# Patient Record
Sex: Female | Born: 1970 | Race: Black or African American | Hispanic: No | Marital: Single | Smoking: Never smoker
Health system: Southern US, Community
[De-identification: ages and names within clinical notes are randomized; demographics above are authoritative.]

## PROBLEM LIST (undated history)

## (undated) DIAGNOSIS — K3184 Gastroparesis: Secondary | ICD-10-CM

## (undated) DIAGNOSIS — E079 Disorder of thyroid, unspecified: Secondary | ICD-10-CM

## (undated) DIAGNOSIS — E119 Type 2 diabetes mellitus without complications: Secondary | ICD-10-CM

## (undated) HISTORY — PX: ABDOMINAL HYSTERECTOMY: SHX81

---

## 2019-07-01 ENCOUNTER — Emergency Department (HOSPITAL_COMMUNITY)
Admission: EM | Admit: 2019-07-01 | Discharge: 2019-07-02 | Disposition: A | Discharge status: Home / Self Care | Payer: BC Managed Care – PPO | Attending: Emergency Medicine | Admitting: Emergency Medicine

## 2019-07-01 ENCOUNTER — Emergency Department (HOSPITAL_COMMUNITY): Payer: BC Managed Care – PPO

## 2019-07-01 ENCOUNTER — Encounter (HOSPITAL_COMMUNITY): Payer: Self-pay | Admitting: Emergency Medicine

## 2019-07-01 DIAGNOSIS — R109 Unspecified abdominal pain: Secondary | ICD-10-CM | POA: Insufficient documentation

## 2019-07-01 DIAGNOSIS — E119 Type 2 diabetes mellitus without complications: Secondary | ICD-10-CM | POA: Insufficient documentation

## 2019-07-01 DIAGNOSIS — M25551 Pain in right hip: Secondary | ICD-10-CM | POA: Diagnosis not present

## 2019-07-01 HISTORY — DX: Disorder of thyroid, unspecified: E07.9

## 2019-07-01 HISTORY — DX: Gastroparesis: K31.84

## 2019-07-01 HISTORY — DX: Type 2 diabetes mellitus without complications: E11.9

## 2019-07-01 MED ORDER — ACETAMINOPHEN 325 MG PO TABS
650.0000 mg | ORAL_TABLET | Freq: Once | ORAL | Status: AC
  Administered 2019-07-01: 650 mg via ORAL
  Filled 2019-07-01: qty 2

## 2019-07-01 NOTE — ED Triage Notes (Signed)
Pt presents with family c/o pain from R scapula to R hip x 5 days, no injury per family.  Pt moaning loudly, very difficult to understand, pt does state she took tramadol today.

## 2019-07-02 ENCOUNTER — Emergency Department (HOSPITAL_COMMUNITY): Payer: BC Managed Care – PPO

## 2019-07-02 LAB — COMPREHENSIVE METABOLIC PANEL
ALT: 11 U/L (ref 0–44)
AST: 15 U/L (ref 15–41)
Albumin: 4.2 g/dL (ref 3.5–5.0)
Alkaline Phosphatase: 113 U/L (ref 38–126)
Anion gap: 16 — ABNORMAL HIGH (ref 5–15)
BUN: 24 mg/dL — ABNORMAL HIGH (ref 6–20)
CO2: 25 mmol/L (ref 22–32)
Calcium: 10.2 mg/dL (ref 8.9–10.3)
Chloride: 87 mmol/L — ABNORMAL LOW (ref 98–111)
Creatinine, Ser: 1.01 mg/dL — ABNORMAL HIGH (ref 0.44–1.00)
GFR calc Af Amer: >60 mL/min (ref >60)
GFR calc non Af Amer: >60 mL/min (ref >60)
Glucose, Bld: 400 mg/dL — ABNORMAL HIGH (ref 70–99)
Potassium: 4.2 mmol/L (ref 3.5–5.1)
Sodium: 128 mmol/L — ABNORMAL LOW (ref 135–145)
Total Bilirubin: 1.3 mg/dL — ABNORMAL HIGH (ref 0.3–1.2)
Total Protein: 8 g/dL (ref 6.5–8.1)

## 2019-07-02 LAB — CBG MONITORING, ED: Glucose-Capillary: 358 mg/dL — ABNORMAL HIGH (ref 70–99)

## 2019-07-02 LAB — URINALYSIS, ROUTINE W REFLEX MICROSCOPIC
Bilirubin Urine: NEGATIVE
Glucose, UA: >=500 mg/dL — AB
Hgb urine dipstick: NEGATIVE
Ketones, ur: 5 mg/dL — AB
Leukocytes,Ua: NEGATIVE
Nitrite: NEGATIVE
Protein, ur: NEGATIVE mg/dL
Specific Gravity, Urine: 1.016 (ref 1.005–1.030)
pH: 5 (ref 5.0–8.0)

## 2019-07-02 LAB — POC URINE PREG, ED: Preg Test, Ur: NEGATIVE

## 2019-07-02 LAB — CBC WITH DIFFERENTIAL/PLATELET
Abs Immature Granulocytes: 0.02 10*3/uL (ref 0.00–0.07)
Basophils Absolute: 0 10*3/uL (ref 0.0–0.1)
Basophils Relative: 0 %
Eosinophils Absolute: 0 10*3/uL (ref 0.0–0.5)
Eosinophils Relative: 0 %
HCT: 44.1 % (ref 36.0–46.0)
Hemoglobin: 15.6 g/dL — ABNORMAL HIGH (ref 12.0–15.0)
Immature Granulocytes: 0 %
Lymphocytes Relative: 49 %
Lymphs Abs: 3.8 10*3/uL (ref 0.7–4.0)
MCH: 27.3 pg (ref 26.0–34.0)
MCHC: 35.4 g/dL (ref 30.0–36.0)
MCV: 77.1 fL — ABNORMAL LOW (ref 80.0–100.0)
Monocytes Absolute: 0.7 10*3/uL (ref 0.1–1.0)
Monocytes Relative: 8 %
Neutro Abs: 3.4 10*3/uL (ref 1.7–7.7)
Neutrophils Relative %: 43 %
Platelets: 392 10*3/uL (ref 150–400)
RBC: 5.72 MIL/uL — ABNORMAL HIGH (ref 3.87–5.11)
RDW: 12.6 % (ref 11.5–15.5)
WBC: 8 10*3/uL (ref 4.0–10.5)
nRBC: 0 % (ref 0.0–0.2)

## 2019-07-02 MED ORDER — MORPHINE SULFATE (PF) 4 MG/ML IV SOLN
4.0000 mg | Freq: Once | INTRAVENOUS | Status: AC
  Administered 2019-07-02: 03:00:00 4 mg via INTRAVENOUS
  Filled 2019-07-02: qty 1

## 2019-07-02 MED ORDER — SODIUM CHLORIDE 0.9 % IV BOLUS
1000.0000 mL | Freq: Once | INTRAVENOUS | Status: AC
  Administered 2019-07-02: 1000 mL via INTRAVENOUS

## 2019-07-02 MED ORDER — ONDANSETRON HCL 4 MG/2ML IJ SOLN
4.0000 mg | Freq: Once | INTRAMUSCULAR | Status: AC
  Administered 2019-07-02: 03:00:00 4 mg via INTRAVENOUS
  Filled 2019-07-02: qty 2

## 2019-07-02 NOTE — ED Notes (Signed)
Patient taken to CT.

## 2019-07-02 NOTE — Discharge Instructions (Addendum)
Continue medications as previously prescribed.  Keep a record of your blood sugars over the next few days and take this with you to your next doctor's appointment.  Follow-up with primary doctor in the next 2 to 3 days, and return to the ER if symptoms significantly worsen or change.

## 2019-07-02 NOTE — ED Provider Notes (Signed)
[facility] Provider Note   CSN: [id number] Arrival date & time: 07/01/19  1950     History Chief Complaint  Patient presents with  . Hip Pain    Bernadett Milian is a 49 y.o. female.  Patient is a 49 year old female with past medical history of diabetes and gastroparesis.  She presents today for evaluation of pain in her right hip and flank.  This is been ongoing for the past several days.  It began in the absence of any injury or trauma.  She describes severe pain from her shoulder blade down through her groin that is worse when she moves or palpates the area.  She denies any bowel or bladder complaints.  She denies any numbness or tingling.  The history is provided by the patient.  Hip Pain This is a new problem. The problem occurs constantly. The problem has been gradually worsening. Pertinent negatives include no chest pain. The symptoms are aggravated by bending and twisting. Nothing relieves the symptoms.       Past Medical History:  Diagnosis Date  . Diabetes mellitus without complication ([place])   . Gastroparesis   . Thyroid disease     There are no problems to display for this patient.   Past Surgical History:  Procedure Laterality Date  . ABDOMINAL HYSTERECTOMY       OB History   No obstetric history on file.     No family history on file.  Social History   Tobacco Use  . Smoking status: Never Smoker  . Smokeless tobacco: Never Used  Substance Use Topics  . Alcohol use: Never  . Drug use: Never    Home Medications Prior to Admission medications   Not on File    Allergies    Patient has no known allergies.  Review of Systems   Review of Systems  Cardiovascular: Negative for chest pain.  All other systems reviewed and are negative.   Physical Exam Updated Vital Signs BP 112/80   Pulse 80   Temp (!) 97.1 F (36.2 C)   Resp 17   SpO2 97%   Physical Exam Vitals and nursing note reviewed.    Constitutional:      General: She is not in acute distress.    Appearance: She is well-developed. She is not diaphoretic.  HENT:     Head: Normocephalic and atraumatic.  Cardiovascular:     Rate and Rhythm: Normal rate and regular rhythm.     Heart sounds: No murmur. No friction rub. No gallop.   Pulmonary:     Effort: Pulmonary effort is normal. No respiratory distress.     Breath sounds: Normal breath sounds. No wheezing.  Abdominal:     General: Bowel sounds are normal. There is no distension.     Palpations: Abdomen is soft.     Tenderness: There is no abdominal tenderness.  Musculoskeletal:        General: Normal range of motion.     Cervical back: Normal range of motion and neck supple.  Skin:    General: Skin is warm and dry.  Neurological:     Mental Status: She is alert and oriented to person, place, and time.     ED Results / Procedures / Treatments   Labs (all labs ordered are listed, but only abnormal results are displayed) Labs Reviewed  URINALYSIS, ROUTINE W REFLEX MICROSCOPIC  COMPREHENSIVE METABOLIC PANEL  CBC WITH DIFFERENTIAL/PLATELET  I-STAT BETA HCG BLOOD, ED ([facility], [facility],  [facility] ONLY)    EKG None  Radiology DG Hip Unilat  With Pelvis 2-3 Views Right  Result Date: 07/01/2019 CLINICAL DATA:  49 year old female with right hip pain for 5 days. EXAM: DG HIP (WITH OR WITHOUT PELVIS) 2-3V RIGHT COMPARISON:  None. FINDINGS: The AP pelvis is mildly rotated to the left. Femoral heads are normally located. Bone mineralization is within normal limits. The pelvis appears intact. Proximal left femur appears grossly intact. Proximal right femur appears intact and normal. SI joints are within normal limits. Negative visible bowel gas pattern. Mild pelvic phleboliths. IMPRESSION: Negative radiographic appearance of the right hip and pelvis. Electronically Signed   By: Odessa Fleming M.D.   On: 07/01/2019 21:13    Procedures Procedures (including critical care time)  Medications  Ordered in ED Medications  sodium chloride 0.9 % bolus 1,000 mL (has no administration in time range)  ondansetron (ZOFRAN) injection 4 mg (has no administration in time range)  morphine 4 MG/ML injection 4 mg (has no administration in time range)  acetaminophen (TYLENOL) tablet 650 mg (650 mg Oral Given 07/01/19 2039)    ED Course  I have reviewed the triage vital signs and the nursing notes.  Pertinent labs & imaging results that were available during my care of the patient were reviewed by me and considered in my medical decision making (see chart for details).    MDM Rules/Calculators/A&P  Patient presenting here with complaints of pain in her flank from her shoulder blade to her hip.  This began earlier today in the absence of any injury or trauma.  Patient's physical examination is essentially unremarkable and work-up shows no obvious cause of her discomfort.  Urinalysis is basically clear, laboratory studies show a blood sugar of 400 with anion gap of 16.  She was given NS and sugar rechecked at 358.  Her CO2 is normal and has only trace ketones in her urine.  I do not feel as though this presentation is consistent with DKA and believe she is appropriate for discharge.  Her pain seems very musculoskeletal in nature.  Final Clinical Impression(s) / ED Diagnoses Final diagnoses:  None    Rx / DC Orders ED Discharge Orders    None       Geoffery Lyons, MD 07/02/19 [phone]

## 2019-07-02 NOTE — ED Notes (Signed)
Kimberly Spears, daughter, [phone] would like an update when available

## 2020-12-27 IMAGING — CT CT RENAL STONE PROTOCOL
2 of 4 series · 16 of 46 positions shown, 18 images · non-contrast
Comparison: No prior abdominal imaging. Pelvis and right hip
radiograph yesterday.

CLINICAL DATA: Right flank pain with nausea.

EXAM:
CT ABDOMEN AND PELVIS WITHOUT CONTRAST
TECHNIQUE: Multidetector CT imaging of the abdomen and pelvis was performed
following the standard protocol without IV contrast.

[Series 3: ap without · axial · non-contrast · 0.67mm/px · z∈[-427,-67]mm · 13 of 84 slices shown, 15 images]
[im 6/84  soft-tissue]
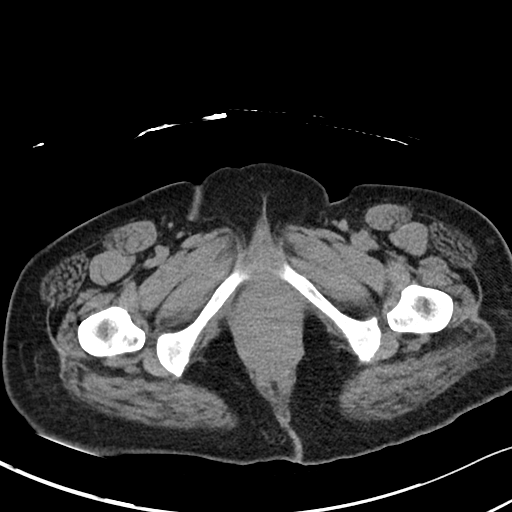
[im 6/84  bone]
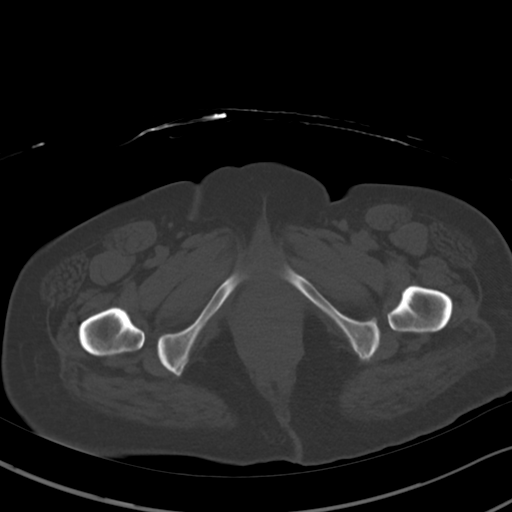
[im 12/84  soft-tissue]
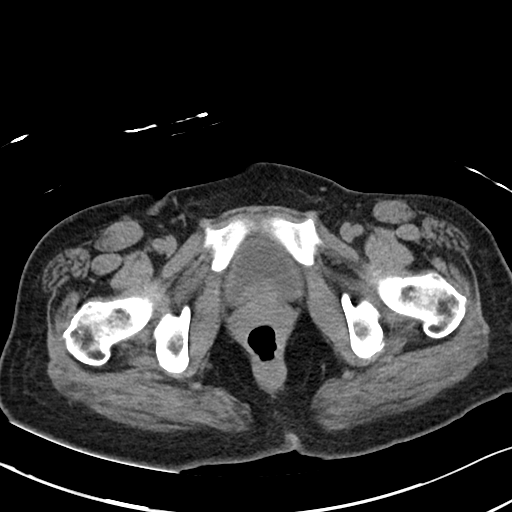
[im 17/84  soft-tissue]
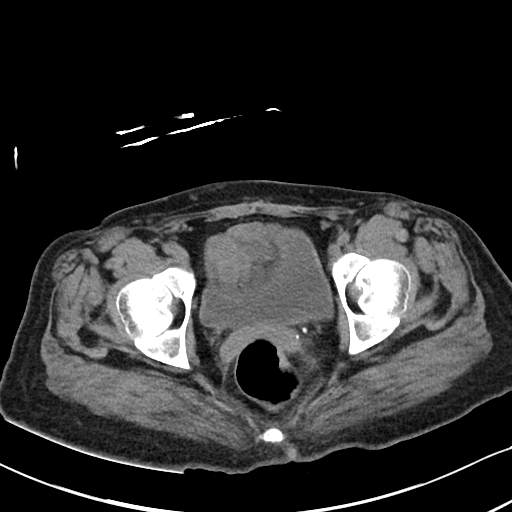
[im 23/84  soft-tissue]
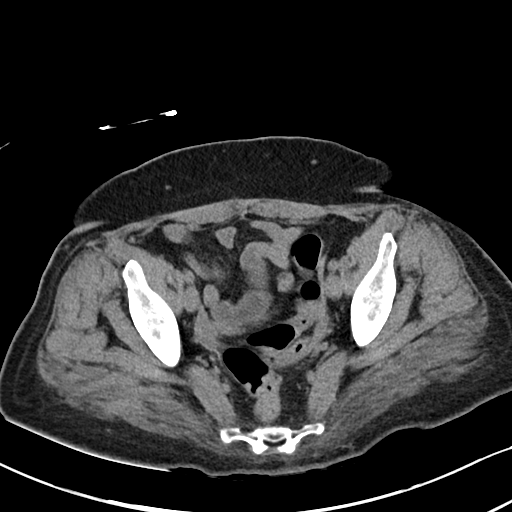
[im 28/84  soft-tissue]
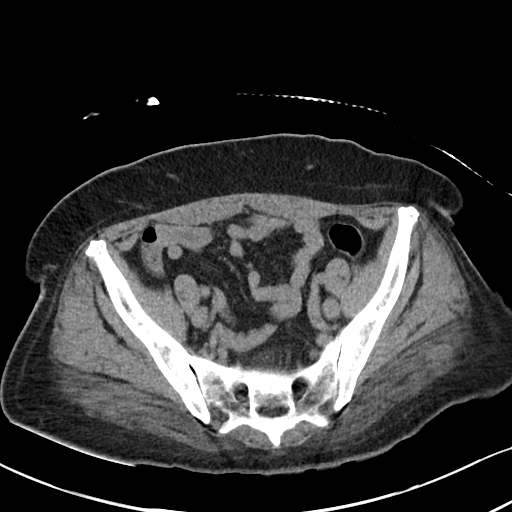
[im 34/84  soft-tissue]
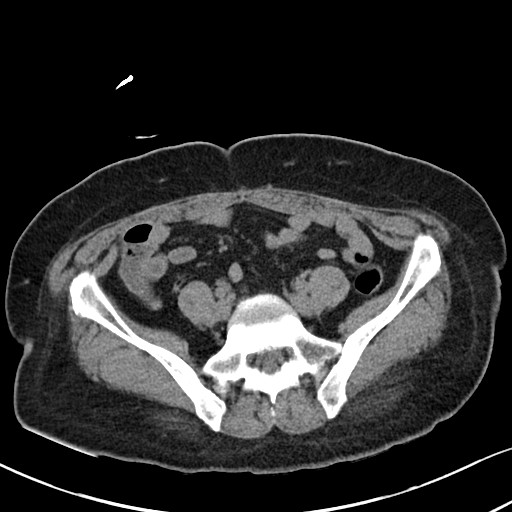
[im 45/84  soft-tissue]
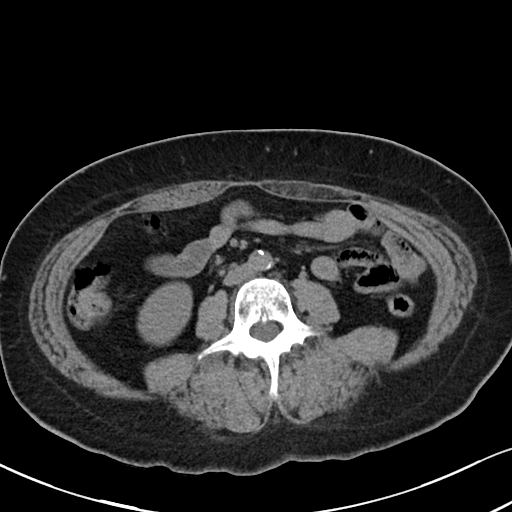
[im 50/84  soft-tissue]
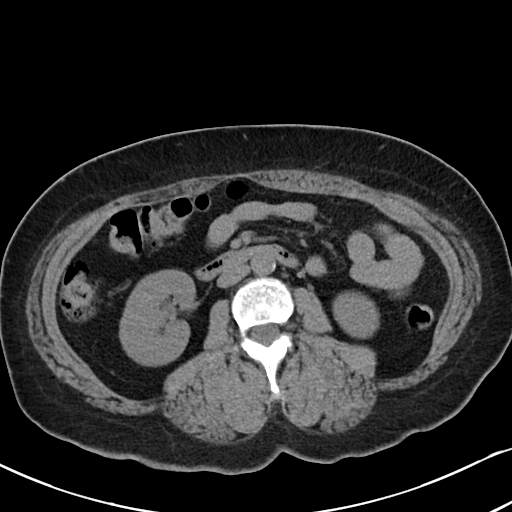
[im 56/84  soft-tissue]
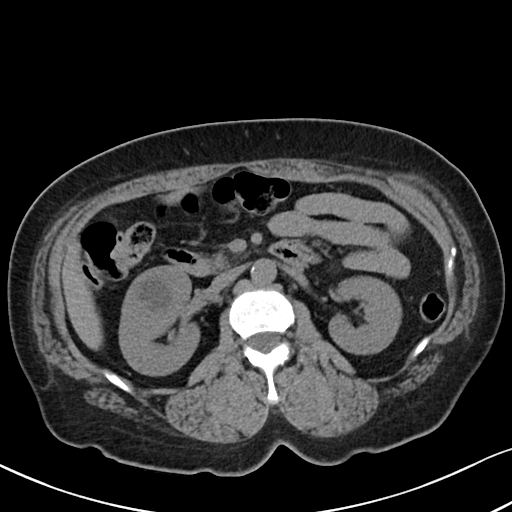
[im 56/84  bone]
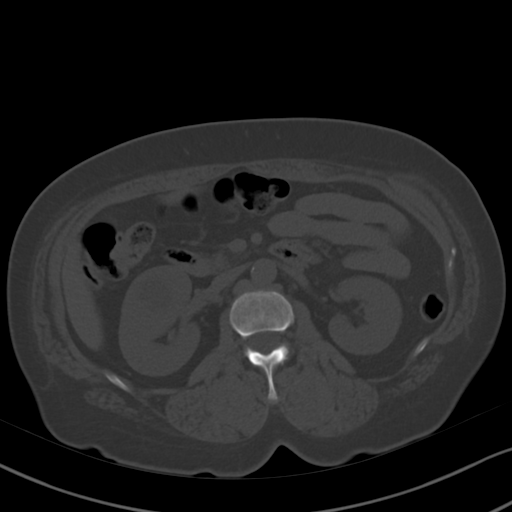
[im 61/84  soft-tissue]
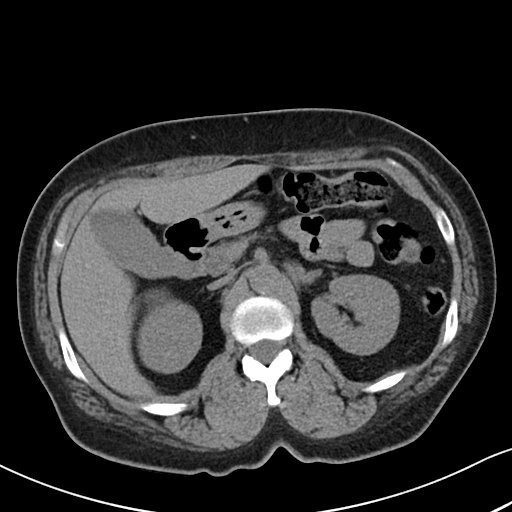
[im 67/84  soft-tissue]
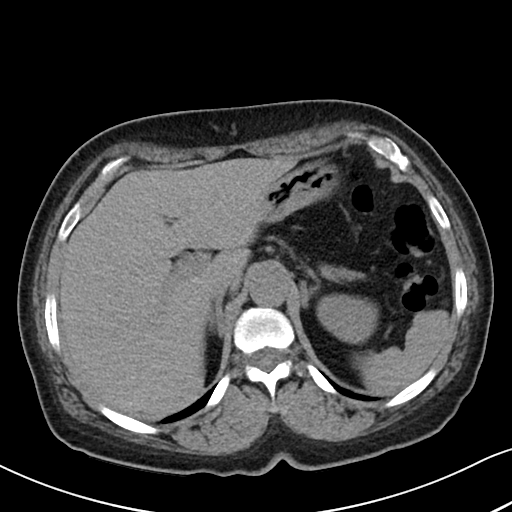
[im 72/84  soft-tissue]
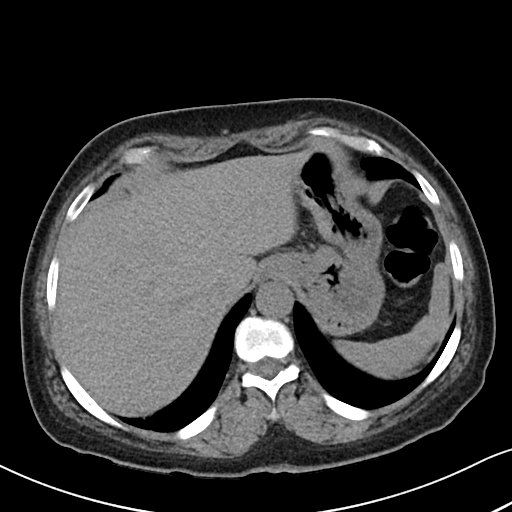
[im 78/84  soft-tissue]
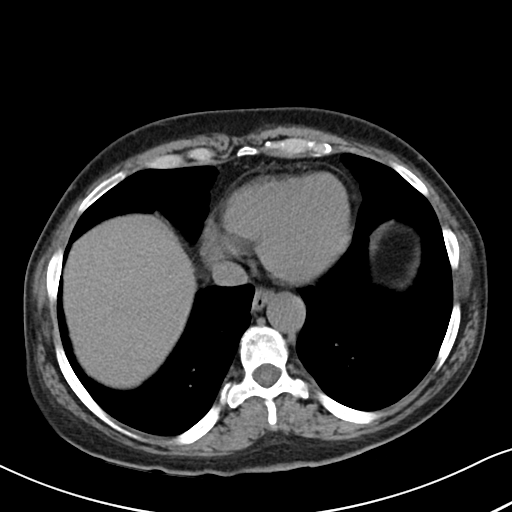

[Series 6: cor · coronal · 0.63mm/px · 3 of 85 slices shown]
[im 29/85  soft-tissue]
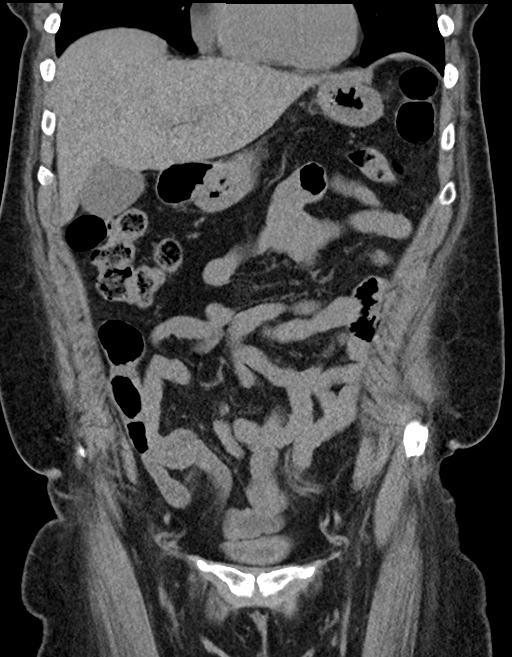
[im 38/85  soft-tissue]
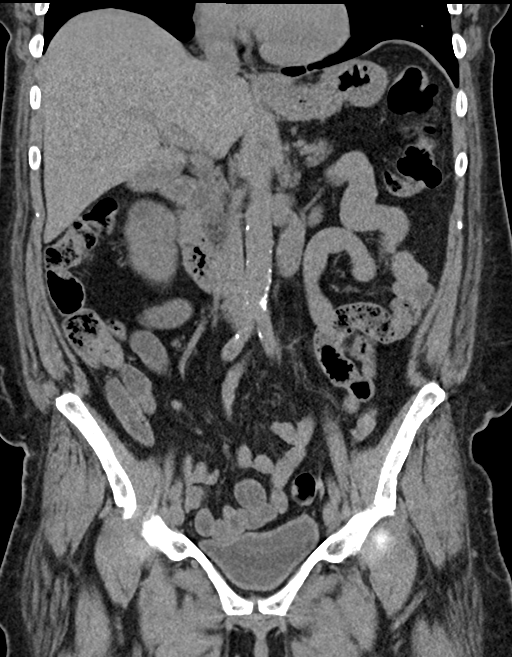
[im 47/85  soft-tissue]
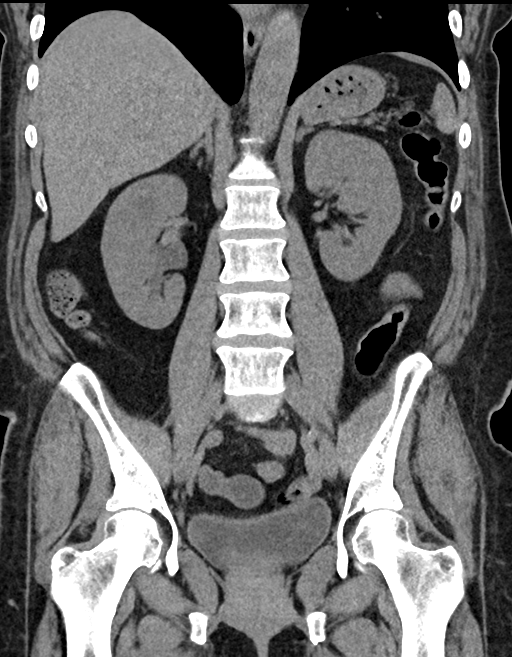

[16 of 46 positions shown; findings below may reference images not displayed]

FINDINGS: Lower chest: Linear atelectasis in the right lower lobe. No
confluent airspace disease. No pleural fluid. Heart is normal in
size.

Hepatobiliary: No focal liver abnormality is seen. No gallstones,
gallbladder wall thickening, or biliary dilatation.

Pancreas: Parenchymal atrophy. No ductal dilatation or inflammation.

Spleen: Normal in size without focal abnormality. Cleft posteriorly.

Adrenals/Urinary Tract: No adrenal nodule. No hydronephrosis. No
renal or ureteral calculi. No perinephric edema. Simple cyst in the
upper right kidney measures 3 cm. No suspicious lesion on
noncontrast exam. Urinary bladder is minimally distended. No bladder
stone or wall thickening.

Stomach/Bowel: Stomach is within normal limits. Appendix appears
normal. No evidence of bowel wall thickening, distention, or
inflammatory changes.

Vascular/Lymphatic: Aortic atherosclerosis. No aortic aneurysm. No
bulky abdominopelvic adenopathy.

Reproductive: Status post hysterectomy. No adnexal masses.

Other: No free air, free fluid, or intra-abdominal fluid collection.

Musculoskeletal: There are no acute or suspicious osseous
abnormalities.
IMPRESSION: No renal stones or obstructive uropathy. No acute abnormality in the
abdomen/pelvis.

Aortic Atherosclerosis ([id number].D).
